# Patient Record
Sex: Female | Born: 1995 | Race: White | Hispanic: No | Marital: Single
Health system: Southern US, Community
[De-identification: ages and names within clinical notes are randomized; demographics above are authoritative.]

---

## 2020-03-07 ENCOUNTER — Other Ambulatory Visit: Payer: Self-pay

## 2020-03-07 ENCOUNTER — Emergency Department
Admission: EM | Admit: 2020-03-07 | Discharge: 2020-03-07 | Disposition: A | Discharge status: Home / Self Care | Payer: 59 | Attending: Student in an Organized Health Care Education/Training Program | Admitting: Student in an Organized Health Care Education/Training Program

## 2020-03-07 DIAGNOSIS — R110 Nausea: Secondary | ICD-10-CM | POA: Diagnosis present

## 2020-03-07 DIAGNOSIS — A084 Viral intestinal infection, unspecified: Secondary | ICD-10-CM | POA: Insufficient documentation

## 2020-03-07 DIAGNOSIS — R202 Paresthesia of skin: Secondary | ICD-10-CM | POA: Insufficient documentation

## 2020-03-07 LAB — URINALYSIS, COMPLETE (UACMP) WITH MICROSCOPIC
Bacteria, UA: NONE SEEN
Bilirubin Urine: NEGATIVE
Glucose, UA: NEGATIVE mg/dL
Hgb urine dipstick: NEGATIVE
Ketones, ur: 20 mg/dL — AB
Leukocytes,Ua: NEGATIVE
Nitrite: NEGATIVE
Protein, ur: 100 mg/dL — AB
Specific Gravity, Urine: 1.023 (ref 1.005–1.030)
pH: 9 — ABNORMAL HIGH (ref 5.0–8.0)

## 2020-03-07 LAB — COMPREHENSIVE METABOLIC PANEL
ALT: 18 U/L (ref 0–44)
AST: 29 U/L (ref 15–41)
Albumin: 4.4 g/dL (ref 3.5–5.0)
Alkaline Phosphatase: 67 U/L (ref 38–126)
Anion gap: 11 (ref 5–15)
BUN: 13 mg/dL (ref 6–20)
CO2: 23 mmol/L (ref 22–32)
Calcium: 9.5 mg/dL (ref 8.9–10.3)
Chloride: 105 mmol/L (ref 98–111)
Creatinine, Ser: 0.75 mg/dL (ref 0.44–1.00)
GFR, Estimated: >60 mL/min (ref >60)
Glucose, Bld: 97 mg/dL (ref 70–99)
Potassium: 3.9 mmol/L (ref 3.5–5.1)
Sodium: 139 mmol/L (ref 135–145)
Total Bilirubin: 0.9 mg/dL (ref 0.3–1.2)
Total Protein: 7.3 g/dL (ref 6.5–8.1)

## 2020-03-07 LAB — CBC
HCT: 40.9 % (ref 36.0–46.0)
Hemoglobin: 14.4 g/dL (ref 12.0–15.0)
MCH: 31.5 pg (ref 26.0–34.0)
MCHC: 35.2 g/dL (ref 30.0–36.0)
MCV: 89.5 fL (ref 80.0–100.0)
Platelets: 272 10*3/uL (ref 150–400)
RBC: 4.57 MIL/uL (ref 3.87–5.11)
RDW: 13.1 % (ref 11.5–15.5)
WBC: 15.6 10*3/uL — ABNORMAL HIGH (ref 4.0–10.5)
nRBC: 0 % (ref 0.0–0.2)

## 2020-03-07 LAB — POC URINE PREG, ED: Preg Test, Ur: NEGATIVE

## 2020-03-07 LAB — LIPASE, BLOOD: Lipase: 22 U/L (ref 11–51)

## 2020-03-07 MED ORDER — ONDANSETRON 4 MG PO TBDP: 4.0000 mg | ORAL_TABLET | Freq: Three times a day (TID) | ORAL | 0 refills | Status: DC | PRN

## 2020-03-07 MED ORDER — ONDANSETRON HCL 4 MG/2ML IJ SOLN
4.0000 mg | Freq: Once | INTRAMUSCULAR | Status: AC
  Administered 2020-03-07: 4 mg via INTRAVENOUS
  Filled 2020-03-07: qty 2

## 2020-03-07 MED ORDER — LACTATED RINGERS IV BOLUS
1000.0000 mL | Freq: Once | INTRAVENOUS | Status: AC
  Administered 2020-03-07: 1000 mL via INTRAVENOUS

## 2020-03-07 MED ORDER — KETOROLAC TROMETHAMINE 30 MG/ML IJ SOLN
15.0000 mg | Freq: Once | INTRAMUSCULAR | Status: AC
  Administered 2020-03-07: 15 mg via INTRAVENOUS
  Filled 2020-03-07: qty 1

## 2020-03-07 NOTE — ED Triage Notes (Signed)
Pt here with N/V and feels numb all over her body. Pt states that the symptoms started last night. Pt NAD in triage.

## 2020-03-07 NOTE — ED Notes (Signed)
Pt requesting to leave at this time. Pt able to tolerate ice chips PO with no issue. States that nausea is gone

## 2020-03-07 NOTE — ED Provider Notes (Signed)
[facility] Emergency Department Provider Note ____________________________________________   First MD Initiated Contact with Patient 03/07/20 1502     (approximate)  I have reviewed the triage vital signs and the nursing notes.   HISTORY  Chief Complaint Nausea  HPI Tami Barnett is a 24 y.o. female with no previous chronic medical history presents to the emergency department for treatment and evaluation of nausea and generalized numbness. She has had nausea and vomiting since last night. She is unable to tolerate food or fluids. No known fever. No diarrhea. No cough or sore throat.     No past medical history on file.  There are no problems to display for this patient.  Prior to Admission medications   Medication Sig Start Date End Date Taking? Authorizing Provider  ondansetron (ZOFRAN-ODT) 4 MG disintegrating tablet Take 1 tablet (4 mg total) by mouth every 8 (eight) hours as needed for nausea or vomiting. 03/07/20   Chinita Pester, FNP    Allergies Patient has no allergy information on record.  No family history on file.  Social History Social History   Tobacco Use  . Smoking status: Not on file  Substance Use Topics  . Alcohol use: Not on file  . Drug use: Not on file    Review of Systems  Constitutional: No fever/chills Eyes: No visual changes. ENT: No sore throat. Cardiovascular: Denies chest pain. Respiratory: Denies shortness of breath. Gastrointestinal: No abdominal pain. Positive for nausea and vomiting. No diarrhea.  No constipation. Genitourinary: Negative for dysuria. Musculoskeletal: Negative for back pain. Skin: Negative for rash. Neurological: Negative for headaches, focal weakness or numbness. ___________________________________________   PHYSICAL EXAM:  VITAL SIGNS: ED Triage Vitals  Enc Vitals Group     BP 03/07/20 1358 112/75     Pulse Rate 03/07/20 1358 78     Resp 03/07/20 1358 16     Temp 03/07/20  1358 98.4 F (36.9 C)     Temp Source 03/07/20 1358 Oral     SpO2 03/07/20 1358 96 %     Weight 03/07/20 1359 120 lb (54.4 kg)     Height 03/07/20 1359 5\' 1"  (1.549 m)     Head Circumference --      Peak Flow --      Pain Score 03/07/20 1359 5     Pain Loc --      Pain Edu? --      Excl. in GC? --     Constitutional: Alert and oriented. Well appearing and in no acute distress. Eyes: Conjunctivae are normal. Head: Atraumatic. Nose: No congestion/rhinnorhea. Mouth/Throat: Mucous membranes are moist. Oropharynx non-erythematous. Neck: No stridor.   Hematological/Lymphatic/Immunilogical: No cervical lymphadenopathy. Cardiovascular: Normal rate, regular rhythm. Grossly normal heart sounds.  Good peripheral circulation. Respiratory: Normal respiratory effort.  No retractions. Lungs CTAB. Gastrointestinal: Soft and nontender. No distention. No abdominal bruits. No CVA tenderness. Genitourinary:  Musculoskeletal: No lower extremity tenderness nor edema.  No joint effusions. Neurologic:  Normal speech and language. No gross focal neurologic deficits are appreciated. No gait instability. Skin:  Skin is warm, dry and intact. No rash noted. Psychiatric: Mood and affect are normal. Speech and behavior are normal.  ____________________________________________   LABS (all labs ordered are listed, but only abnormal results are displayed)  Labs Reviewed  CBC - Abnormal; Notable for the following components:      Result Value   WBC 15.6 (*)    All other components within normal limits  URINALYSIS, COMPLETE (  UACMP) WITH MICROSCOPIC - Abnormal; Notable for the following components:   Color, Urine YELLOW (*)    APPearance CLEAR (*)    pH 9.0 (*)    Ketones, ur 20 (*)    Protein, ur 100 (*)    All other components within normal limits  LIPASE, BLOOD  COMPREHENSIVE METABOLIC PANEL  POC URINE PREG, ED   ____________________________________________  EKG  Not  indicated. ____________________________________________  RADIOLOGY  ED MD interpretation:    Not indicated.  I, Kem Boroughs, personally viewed and evaluated these images (plain radiographs) as part of my medical decision making, as well as reviewing the written report by the radiologist.  Official radiology report(s): No results found.  ____________________________________________   PROCEDURES  Procedure(s) performed (including Critical Care):  Procedures  ____________________________________________   INITIAL IMPRESSION / ASSESSMENT AND PLAN     24 year old female presenting to the ER for treatment and evaluation after having nausea and vomiting since last night. See HPI for further details.  DIFFERENTIAL DIAGNOSIS  Gastroenteritis, COVID, influenza  ED COURSE  Symptoms and exam most consistent with gastroenteritis. Pregnancy test is negative. Mild leukocytosis without left shift. Urinalysis consistent with mild dehydration with 20 Ketones, pH of 9.0, and protein of 100. Will order fluids, toradol, and zofran. Patient aware and agreeable to the plan.  Significant relief after fluids and medications. Patient tolerated ice chips and requested to be discharged. She will receive a prescription for Zofran and be advised to follow up with PCP or return to the ER for symptoms of concern.   ___________________________________________   FINAL CLINICAL IMPRESSION(S) / ED DIAGNOSES  Final diagnoses:  Viral gastroenteritis     ED Discharge Orders         Ordered    ondansetron (ZOFRAN-ODT) 4 MG disintegrating tablet  Every 8 hours PRN        03/07/20 1706           Tami Barnett was evaluated in Emergency Department on 03/07/2020 for the symptoms described in the history of present illness. She was evaluated in the context of the global COVID-19 pandemic, which necessitated consideration that the patient might be at risk for infection with the SARS-CoV-2 virus  that causes COVID-19. Institutional protocols and algorithms that pertain to the evaluation of patients at risk for COVID-19 are in a state of rapid change based on information released by regulatory bodies including the CDC and federal and state organizations. These policies and algorithms were followed during the patient's care in the ED.   Note:  This document was prepared using Dragon voice recognition software and may include unintentional dictation errors.   Chinita Pester, FNP 03/07/20 1718    Willy Eddy, MD 03/07/20 [phone]

## 2020-04-18 ENCOUNTER — Emergency Department: Payer: BC Managed Care – PPO

## 2020-04-18 ENCOUNTER — Encounter: Payer: Self-pay | Admitting: Emergency Medicine

## 2020-04-18 ENCOUNTER — Emergency Department
Admission: EM | Admit: 2020-04-18 | Discharge: 2020-04-18 | Disposition: A | Discharge status: Home / Self Care | Payer: BC Managed Care – PPO | Attending: Emergency Medicine | Admitting: Emergency Medicine

## 2020-04-18 DIAGNOSIS — R1013 Epigastric pain: Secondary | ICD-10-CM | POA: Diagnosis not present

## 2020-04-18 DIAGNOSIS — A09 Infectious gastroenteritis and colitis, unspecified: Secondary | ICD-10-CM

## 2020-04-18 DIAGNOSIS — E876 Hypokalemia: Secondary | ICD-10-CM | POA: Diagnosis not present

## 2020-04-18 LAB — CBC
HCT: 42 % (ref 36.0–46.0)
Hemoglobin: 15 g/dL (ref 12.0–15.0)
MCH: 31.7 pg (ref 26.0–34.0)
MCHC: 35.7 g/dL (ref 30.0–36.0)
MCV: 88.8 fL (ref 80.0–100.0)
Platelets: 361 10*3/uL (ref 150–400)
RBC: 4.73 MIL/uL (ref 3.87–5.11)
RDW: 12.1 % (ref 11.5–15.5)
WBC: 20 10*3/uL — ABNORMAL HIGH (ref 4.0–10.5)
nRBC: 0 % (ref 0.0–0.2)

## 2020-04-18 LAB — COMPREHENSIVE METABOLIC PANEL
ALT: 16 U/L (ref 0–44)
AST: 31 U/L (ref 15–41)
Albumin: 4.6 g/dL (ref 3.5–5.0)
Alkaline Phosphatase: 69 U/L (ref 38–126)
Anion gap: 18 — ABNORMAL HIGH (ref 5–15)
BUN: 11 mg/dL (ref 6–20)
CO2: 14 mmol/L — ABNORMAL LOW (ref 22–32)
Calcium: 9.8 mg/dL (ref 8.9–10.3)
Chloride: 106 mmol/L (ref 98–111)
Creatinine, Ser: 0.8 mg/dL (ref 0.44–1.00)
GFR, Estimated: >60 mL/min (ref >60)
Glucose, Bld: 196 mg/dL — ABNORMAL HIGH (ref 70–99)
Potassium: 3.3 mmol/L — ABNORMAL LOW (ref 3.5–5.1)
Sodium: 138 mmol/L (ref 135–145)
Total Bilirubin: 1 mg/dL (ref 0.3–1.2)
Total Protein: 7.8 g/dL (ref 6.5–8.1)

## 2020-04-18 LAB — LIPASE, BLOOD: Lipase: 24 U/L (ref 11–51)

## 2020-04-18 LAB — HCG, QUANTITATIVE, PREGNANCY: hCG, Beta Chain, Quant, S: <1 m[IU]/mL (ref <5)

## 2020-04-18 MED ORDER — ONDANSETRON 4 MG PO TBDP: 4.0000 mg | ORAL_TABLET | Freq: Three times a day (TID) | ORAL | 0 refills | Status: AC | PRN

## 2020-04-18 MED ORDER — POTASSIUM CHLORIDE ER 10 MEQ PO TBCR: 10.0000 meq | EXTENDED_RELEASE_TABLET | Freq: Every day | ORAL | 0 refills | Status: AC

## 2020-04-18 MED ORDER — SODIUM CHLORIDE 0.9 % IV BOLUS
1000.0000 mL | Freq: Once | INTRAVENOUS | Status: AC
  Administered 2020-04-18: 1000 mL via INTRAVENOUS

## 2020-04-18 MED ORDER — ONDANSETRON 4 MG PO TBDP: 4.0000 mg | ORAL_TABLET | Freq: Once | ORAL | Status: DC

## 2020-04-18 MED ORDER — ONDANSETRON HCL 4 MG/2ML IJ SOLN: 4.0000 mg | Freq: Once | INTRAMUSCULAR | Status: DC

## 2020-04-18 MED ORDER — ONDANSETRON HCL 4 MG/2ML IJ SOLN
INTRAMUSCULAR | Status: AC
  Filled 2020-04-18: qty 2

## 2020-04-18 MED ORDER — ONDANSETRON 4 MG PO TBDP
ORAL_TABLET | ORAL | Status: AC
  Filled 2020-04-18: qty 1

## 2020-04-18 MED ORDER — HALOPERIDOL LACTATE 5 MG/ML IJ SOLN
2.0000 mg | Freq: Once | INTRAMUSCULAR | Status: AC
  Administered 2020-04-18: 2 mg via INTRAVENOUS
  Filled 2020-04-18: qty 1

## 2020-04-18 NOTE — ED Triage Notes (Signed)
Pt to ED via POV with c/o SOB, syncopal episode PTA, and emesis. Pt noted to be coughing and dry heaving in triage, pt noted to be hyperventilating in triage, encouraged to take slow deep breaths while in triage. Pt states she feels like she is going to pass out. VSS in triage.

## 2020-04-18 NOTE — Discharge Instructions (Signed)
Take the Zofran to help with nausea.  Take the potassium pills due to your potassium being slightly low.  Take Tylenol to help with any pain.  Return to the ER if you develop worsening fevers, chest pain, abdominal pain or any other concerns

## 2020-04-18 NOTE — ED Provider Notes (Signed)
[facility] Emergency Department Provider Note  ____________________________________________   Event Date/Time   First MD Initiated Contact with Patient 04/18/20 1435     (approximate)  I have reviewed the triage vital signs and the nursing notes.   HISTORY  Chief Complaint Loss of Consciousness, Shortness of Breath (/), and Emesis    HPI Tami Barnett is a 25 y.o. female who is otherwise healthy who comes in with shortness of breath and emesis and abdominal pain.  Patient states that she started having vomiting that started this morning, severe, constant, nothing makes better, nothing makes it worse.  She reports upper abdominal tenderness associated with it.  She states that due to the pain she feels like she cannot catch her breath.  She reports tingling throughout her entire body.  She does report a history of anxiety is on multiple medications for.  She is not sure if that is what is causing her to feel short of breath.  Denies any risk factors for PE.  Has a Nexplanon implant.  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  Denies any alcohol use today.  Seems like patient got lightheaded and had a brief syncopal episode in the waiting room but patient states that she does not remember this.  She has no evidence of head trauma denies any headaches   History reviewed. No pertinent past medical history.  There are no problems to display for this patient.   History reviewed. No pertinent surgical history.  Prior to Admission medications   Medication Sig Start Date End Date Taking? Authorizing Provider  ondansetron (ZOFRAN-ODT) 4 MG disintegrating tablet Take 1 tablet (4 mg total) by mouth every 8 (eight) hours as needed for nausea or vomiting. 03/07/20   Chinita Pester, FNP    Allergies Patient has no known allergies.  History reviewed. No pertinent family history.  Social  History      Review of Systems Constitutional: No fever/chills Eyes: No visual changes. ENT: No sore throat. Cardiovascular: Denies chest pain. Respiratory: Denies shortness of breath.  Cannot catch her breath Gastrointestinal: Abdominal pain, nausea, vomiting, diarrhea Genitourinary: Negative for dysuria. Musculoskeletal: Negative for back pain. Skin: Negative for rash. Neurological: Negative for headaches, focal weakness or numbness.  Tingling All other ROS negative ____________________________________________   PHYSICAL EXAM:  VITAL SIGNS: ED Triage Vitals  Enc Vitals Group     BP 04/18/20 1232 131/89     Pulse Rate 04/18/20 1232 73     Resp 04/18/20 1232 (!) 26     Temp 04/18/20 1232 (!) 96.5 F (35.8 C)     Temp Source 04/18/20 1232 Axillary     SpO2 04/18/20 1232 100 %     Weight 04/18/20 1234 119 lb 14.9 oz (54.4 kg)     Height 04/18/20 1234 5\' 1"  (1.549 m)     Head Circumference --      Peak Flow --      Pain Score --      Pain Loc --      Pain Edu? --      Excl. in GC? --     Constitutional: Alert and oriented. Eyes: Conjunctivae are normal. EOMI. Head: Atraumatic. Nose: No congestion/rhinnorhea. Mouth/Throat: Mucous membranes are moist.  No crepitus Neck: No stridor. Trachea Midline. FROM Cardiovascular: Normal rate, regular rhythm. Grossly normal heart sounds.  Good peripheral circulation. Respiratory: Patient hyperventilating no retractions. Lungs CTAB. Gastrointestinal:  Soft and nontender. No distention. No abdominal bruits.  Dry heaving Musculoskeletal: No lower extremity tenderness nor edema.  No joint effusions. Neurologic:  Normal speech and language. No gross focal neurologic deficits are appreciated.  Skin:  Skin is warm, dry and intact. No rash noted. Psychiatric: Mood and affect are normal. Speech and behavior are normal. GU: Deferred   ____________________________________________   LABS (all labs ordered are listed, but only abnormal  results are displayed)  Labs Reviewed  COMPREHENSIVE METABOLIC PANEL - Abnormal; Notable for the following components:      Result Value   Potassium 3.3 (*)    CO2 14 (*)    Glucose, Bld 196 (*)    Anion gap 18 (*)    All other components within normal limits  CBC - Abnormal; Notable for the following components:   WBC 20.0 (*)    All other components within normal limits  LIPASE, BLOOD  HCG, QUANTITATIVE, PREGNANCY  URINALYSIS, COMPLETE (UACMP) WITH MICROSCOPIC   ____________________________________________   ED ECG REPORT I, Concha Se, the attending physician, personally viewed and interpreted this ECG.  Normal sinus rate of 71, no ST elevation, no T wave inversions, normal intervals ____________________________________________  RADIOLOGY Vela Prose, personally viewed and evaluated these images (plain radiographs) as part of my medical decision making, as well as reviewing the written report by the radiologist.  ED MD interpretation: No pneumonia  Official radiology report(s): DG Chest Portable 1 View  Result Date: 04/18/2020 CLINICAL DATA:  Shortness of breath. EXAM: PORTABLE CHEST 1 VIEW COMPARISON:  None. FINDINGS: The heart size and mediastinal contours are within normal limits. Both lungs are clear. No pneumothorax or pleural effusion is noted. The visualized skeletal structures are unremarkable. IMPRESSION: No active disease. Electronically Signed   By: Lupita Raider M.D.   On: 04/18/2020 14:51    ____________________________________________   PROCEDURES  Procedure(s) performed (including Critical Care):  Ultrasound ED Peripheral IV (Provider)  Date/Time: 04/18/2020 3:26 PM Performed by: Concha Se, MD Authorized by: Concha Se, MD   Procedure details:    Indications: hydration and poor IV access     Skin Prep: chlorhexidine gluconate     Location:  Right forearm   Angiocath:  20 G   Bedside Ultrasound Guided: Yes     Images: not archived      Patient tolerated procedure without complications: Yes     Dressing applied: Yes       ____________________________________________   INITIAL IMPRESSION / ASSESSMENT AND PLAN / ED COURSE  Tami Barnett was evaluated in Emergency Department on 04/18/2020 for the symptoms described in the history of present illness. She was evaluated in the context of the global COVID-19 pandemic, which necessitated consideration that the patient might be at risk for infection with the SARS-CoV-2 virus that causes COVID-19. Institutional protocols and algorithms that pertain to the evaluation of patients at risk for COVID-19 are in a state of rapid change based on information released by regulatory bodies including the CDC and federal and state organizations. These policies and algorithms were followed during the patient's care in the ED.    Patient is a 25 year old who comes in with upper abdominal pain and vomiting.  Labs ordered evaluate for Electra abnormalities, AKI, pregnancy.  LFTs were ordered to evaluate for gallbladder pathology versus pancreatitis.  Will get ultrasound to evaluate for gallstones given some pain.  Suspect that her heavy breathing is more likely from anxiety versus the pain.  We will get a chest x-ray to make sure no evidence of pneumonia or pneumothorax.  Considered PE but given the abdominal pain nausea and vomiting seems  less likely.  We discussed different treatment modalities and have agreed to trial 2 mg of Haldol with some IV fluid for possible dehydration.  Negative  pregnancy.  Labs are reassuring no signs of severe dehydration.  Anion gap was slightly elevated which she is getting some fluids for.  Her sugars were slightly elevated and I will have her follow-up outpatient for recheck of her sugar given this was not a fasting sugar.  Her white count was 20 which was more likely just reactive from all the vomiting.  I really pushed on her chest wall and there was no chest wall  tenderness to suggest esophageal rupture.  She had an ultrasound that did not show evidence of gallstones.  Repeat evaluation of her abdomen was soft and nontender.  She denies any pain in her chest abdomen or pelvis at this time.  She is feeling much better.  Her breathing has slowed down and she has normal respiratory rate and remains 100% on room air.  I suspect that this could be gastroenteritis given the vomiting with the diarrhea.  At this time we discussed CT imaging but have agreed to hold off due to her low suspicion for acute process.  She is got no lower abdominal pain and no crepitus on exam.  Patient is requesting testing for celiac.  I discussed with patient that we do not do that the emergency room but I will give her a referral for GI       ____________________________________________   FINAL CLINICAL IMPRESSION(S) / ED DIAGNOSES   Final diagnoses:  Epigastric abdominal pain  Hypokalemia  Infectious colitis, enteritis and gastroenteritis      MEDICATIONS GIVEN DURING THIS VISIT:  Medications  sodium chloride 0.9 % bolus 1,000 mL (1,000 mLs Intravenous New Bag/Given 04/18/20 1548)  haloperidol lactate (HALDOL) injection 2 mg (2 mg Intravenous Given 04/18/20 1549)     ED Discharge Orders         Ordered    potassium chloride (KLOR-CON) 10 MEQ tablet  Daily        04/18/20 1632    ondansetron (ZOFRAN ODT) 4 MG disintegrating tablet  Every 8 hours PRN        04/18/20 1632           Note:  This document was prepared using Dragon voice recognition software and may include unintentional dictation errors.   Concha Se, MD 04/18/20 [phone]

## 2022-01-16 IMAGING — US US ABDOMEN LIMITED
1 series · 15 of 25 positions shown · non-contrast
Comparison: None.

CLINICAL DATA: Epigastric abdominal pain X 6 hours

EXAM:
ULTRASOUND ABDOMEN LIMITED RIGHT UPPER QUADRANT

[Series 1: us abdomen limited ruq · 15 of 48 slices shown]
[im 1/48]
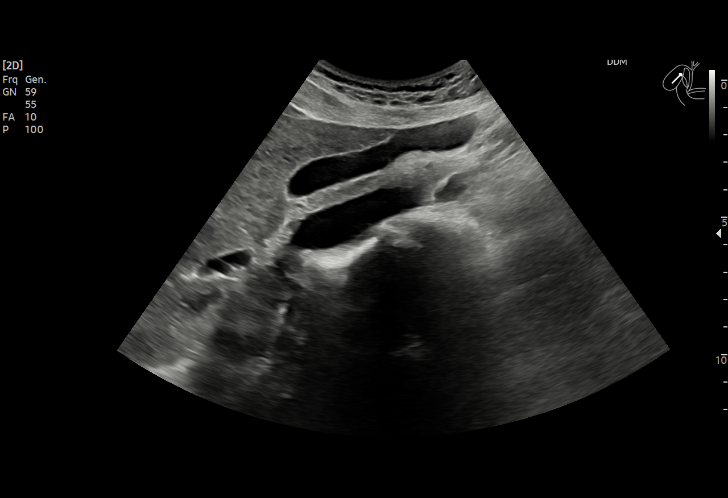
[im 4/48]
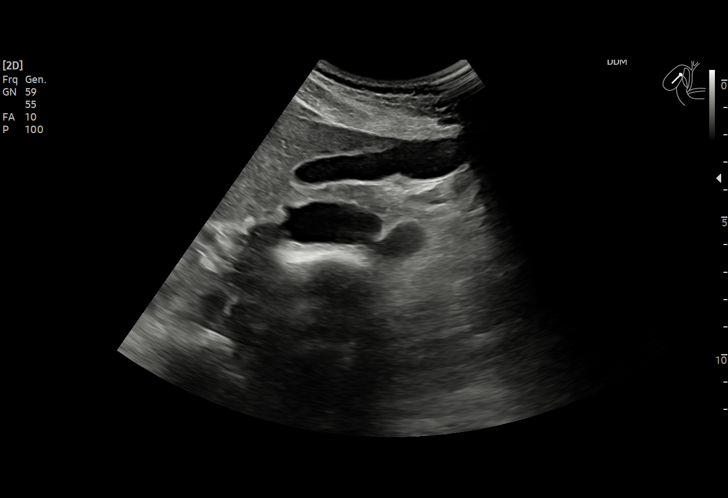
[im 8/48]
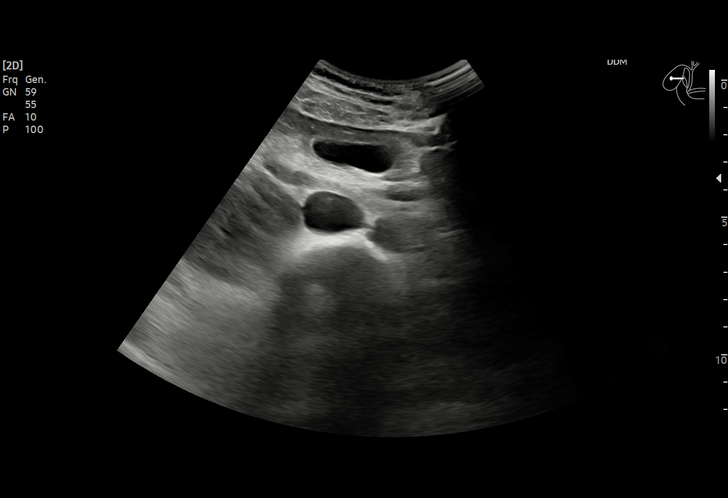
[im 10/48]
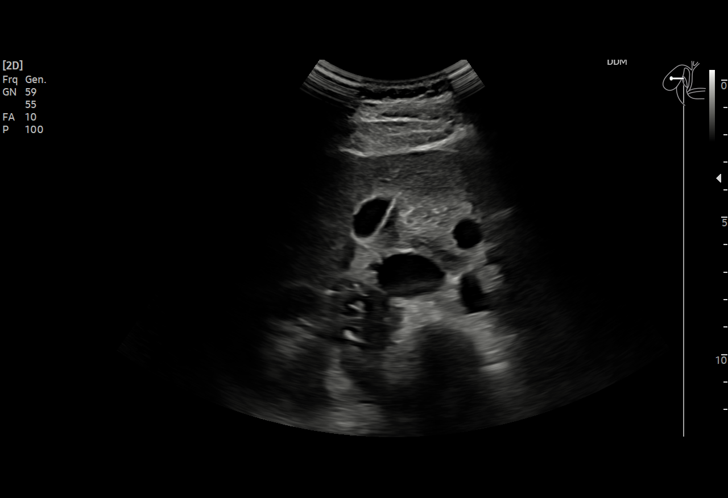
[im 14/48]
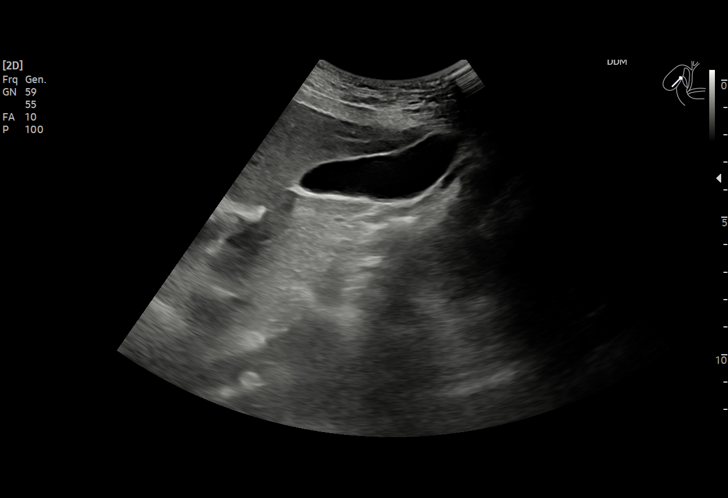
[im 18/48]
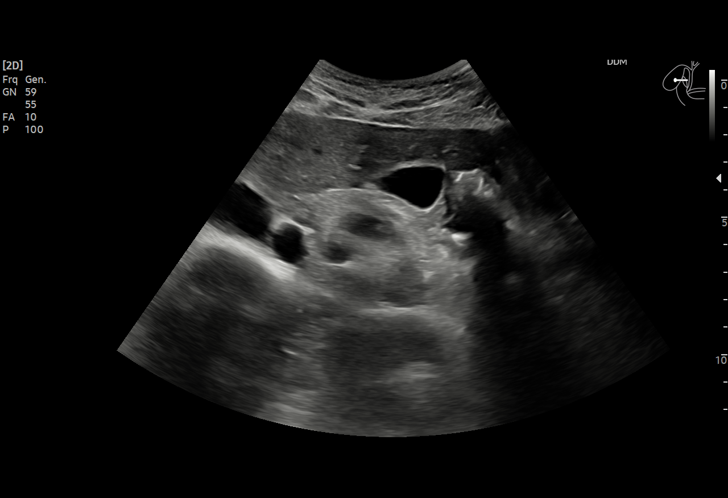
[im 20/48]
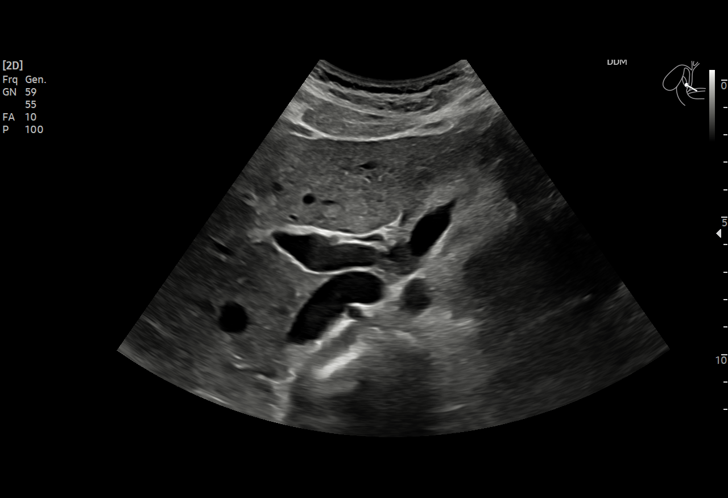
[im 24/48]
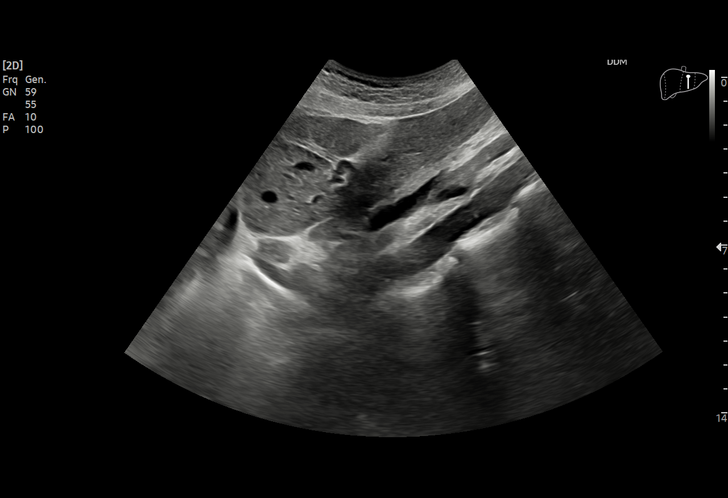
[im 28/48]
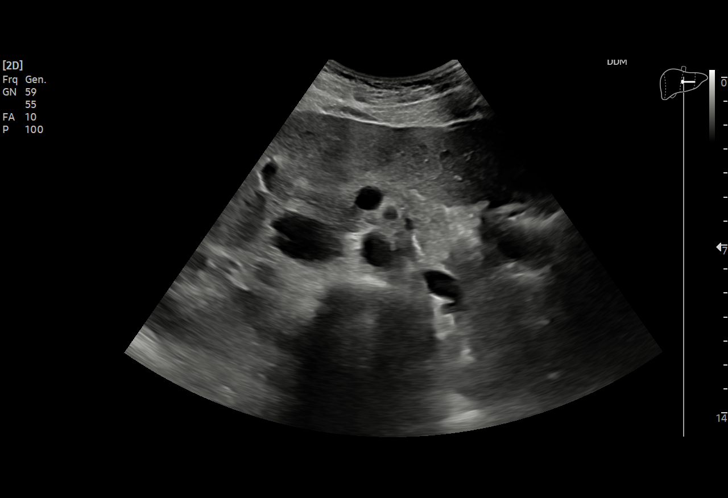
[im 30/48]
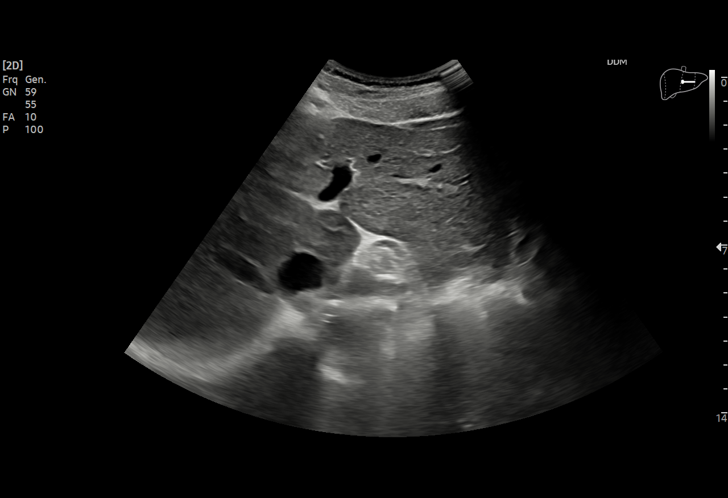
[im 34/48]
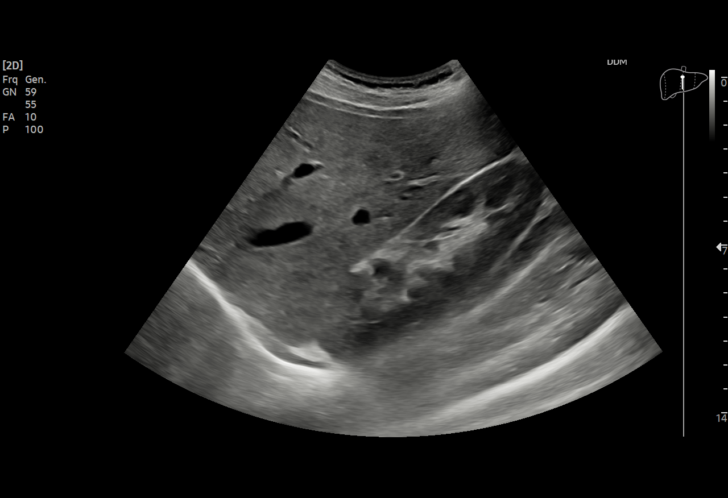
[im 38/48]
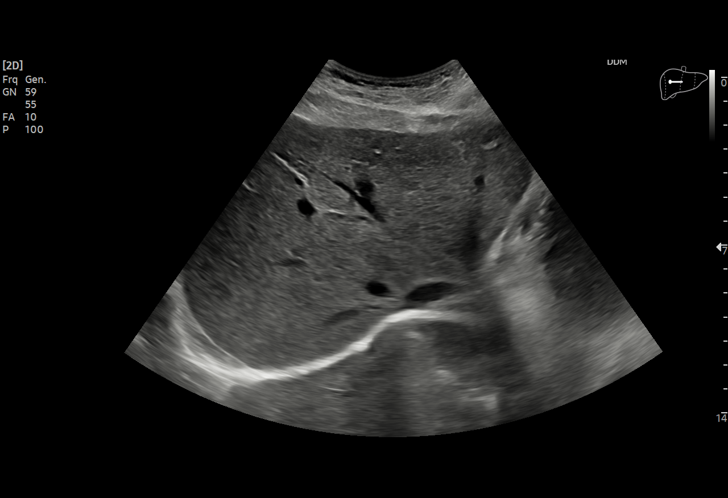
[im 40/48]
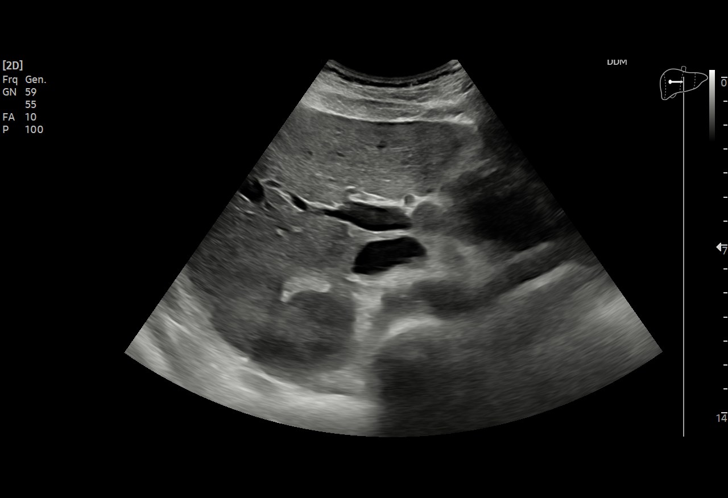
[im 44/48]
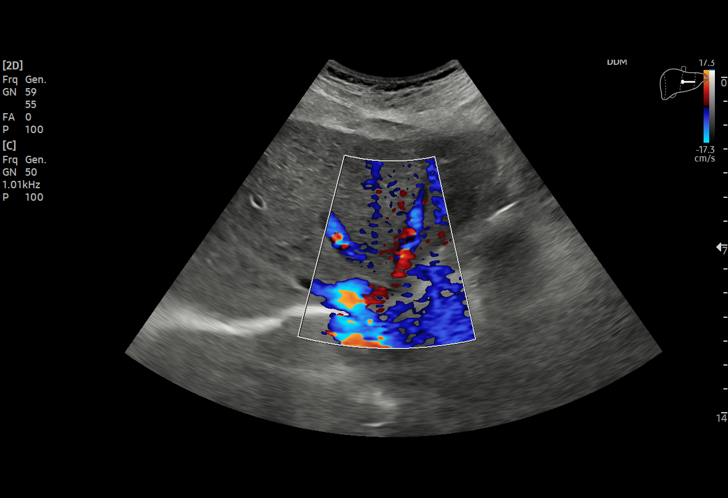
[im 48/48]
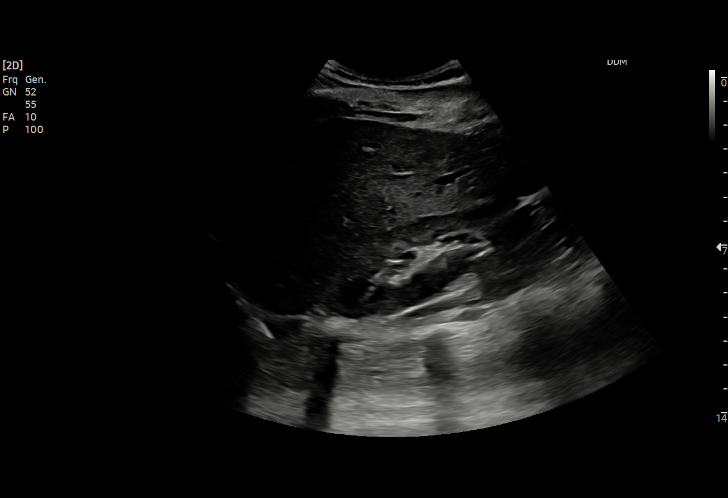

[15 of 25 positions shown; findings below may reference images not displayed]

FINDINGS: Gallbladder:

No gallstones or wall thickening visualized. No sonographic Murphy
sign noted by sonographer.

Common bile duct:

Diameter: 3 mm

Liver:

No focal lesion identified. Within normal limits in parenchymal
echogenicity. Portal vein is patent on color Doppler imaging with
normal direction of blood flow towards the liver.

Other: None.
IMPRESSION: Unremarkable right upper quadrant ultrasound.

## 2022-01-16 IMAGING — DX DG CHEST 1V PORT
1 series · 1 of 1 positions shown · non-contrast
Comparison: None.

CLINICAL DATA: Shortness of breath.

EXAM:
PORTABLE CHEST 1 VIEW

[chest ap]
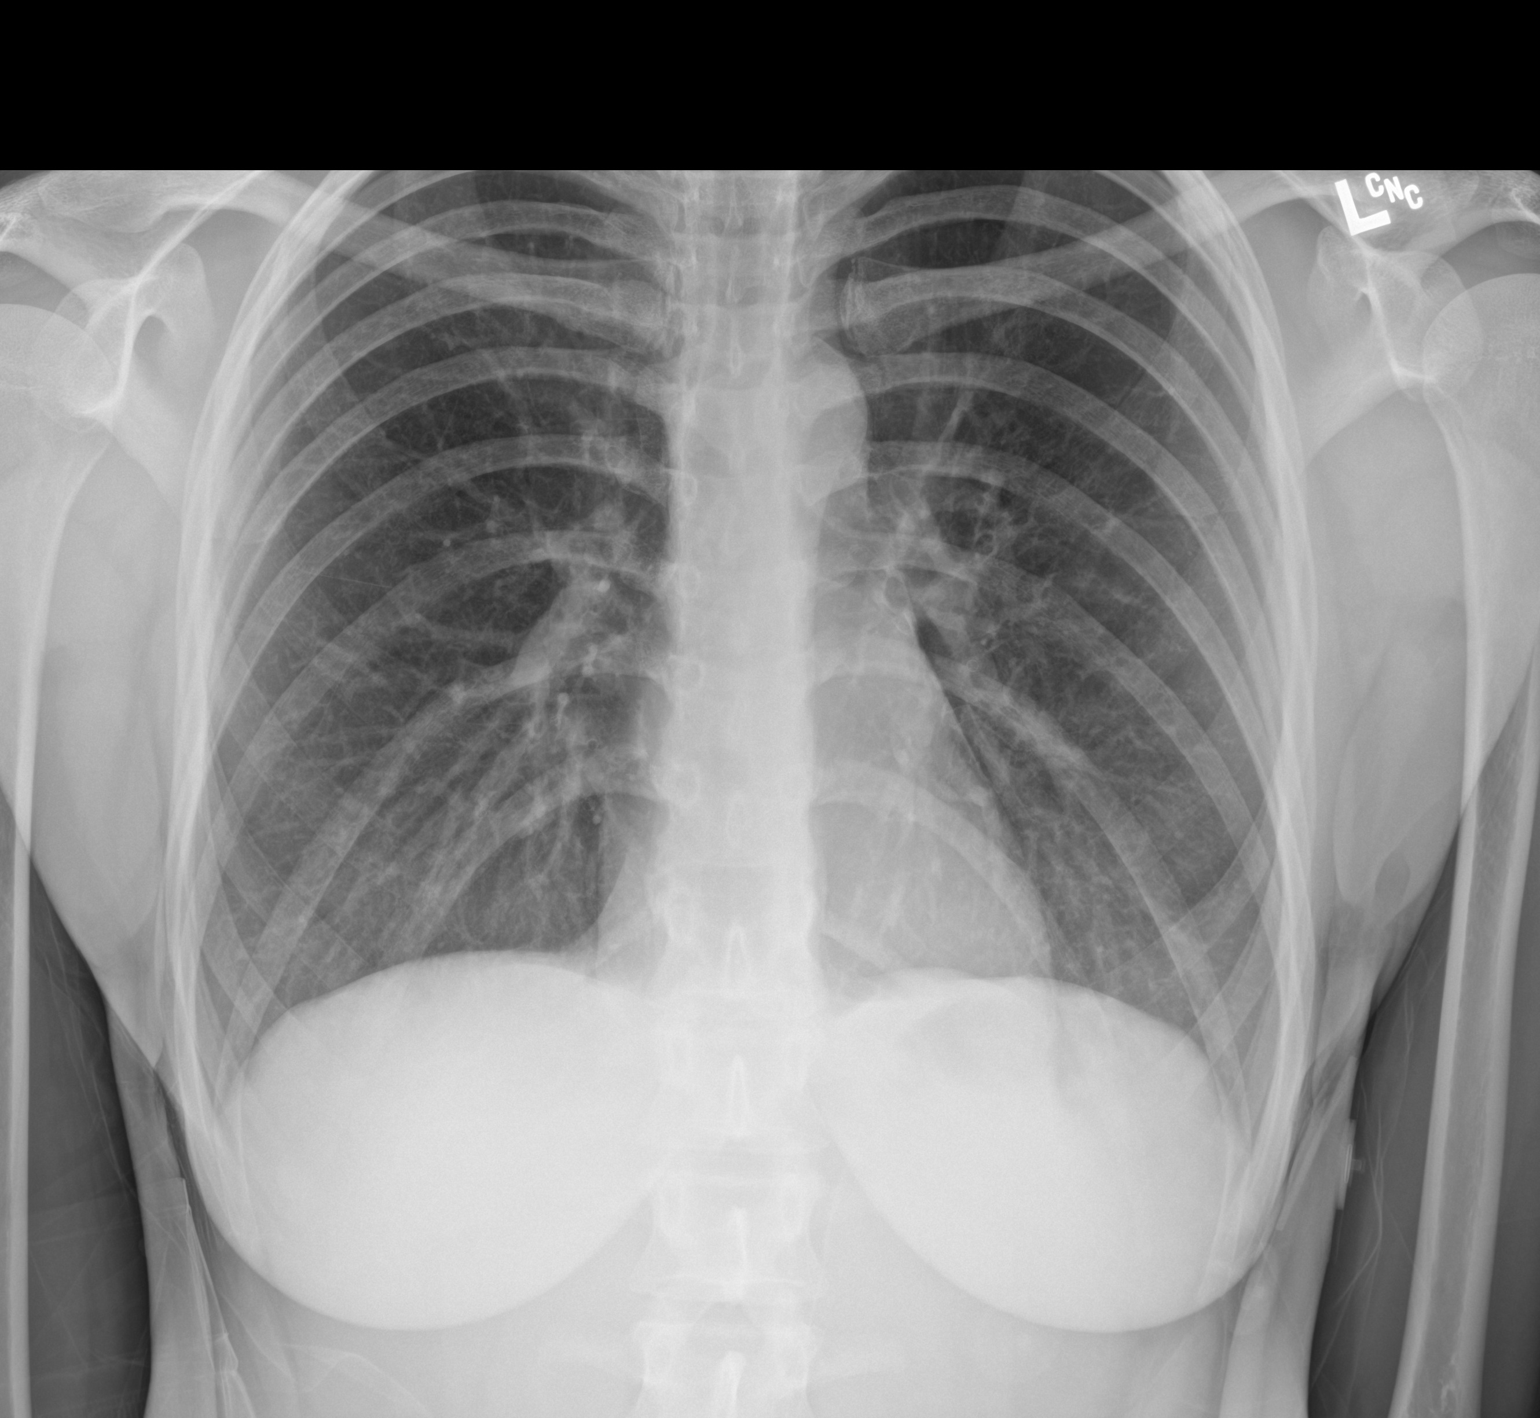

[1 of 1 positions shown; findings below may reference images not displayed]

FINDINGS: The heart size and mediastinal contours are within normal limits.
Both lungs are clear. No pneumothorax or pleural effusion is noted.
The visualized skeletal structures are unremarkable.
IMPRESSION: No active disease.
